# Patient Record
Sex: Male | Born: 1990 | Race: Black or African American | Hispanic: No | Marital: Single | Smoking: Never smoker
Health system: Southern US, Community
[De-identification: ages and names within clinical notes are randomized; demographics above are authoritative.]

---

## 2018-07-11 ENCOUNTER — Emergency Department (HOSPITAL_COMMUNITY)
Admission: EM | Admit: 2018-07-11 | Discharge: 2018-07-11 | Disposition: A | Discharge status: Home / Self Care | Payer: Self-pay | Attending: Emergency Medicine | Admitting: Emergency Medicine

## 2018-07-11 ENCOUNTER — Encounter (HOSPITAL_COMMUNITY): Payer: Self-pay | Admitting: Emergency Medicine

## 2018-07-11 ENCOUNTER — Other Ambulatory Visit: Payer: Self-pay

## 2018-07-11 DIAGNOSIS — T23221A Burn of second degree of single right finger (nail) except thumb, initial encounter: Secondary | ICD-10-CM | POA: Insufficient documentation

## 2018-07-11 DIAGNOSIS — Y929 Unspecified place or not applicable: Secondary | ICD-10-CM | POA: Insufficient documentation

## 2018-07-11 DIAGNOSIS — T23101A Burn of first degree of right hand, unspecified site, initial encounter: Secondary | ICD-10-CM | POA: Insufficient documentation

## 2018-07-11 DIAGNOSIS — X102XXA Contact with fats and cooking oils, initial encounter: Secondary | ICD-10-CM | POA: Insufficient documentation

## 2018-07-11 DIAGNOSIS — Y93G3 Activity, cooking and baking: Secondary | ICD-10-CM | POA: Insufficient documentation

## 2018-07-11 DIAGNOSIS — Z23 Encounter for immunization: Secondary | ICD-10-CM | POA: Insufficient documentation

## 2018-07-11 DIAGNOSIS — Y999 Unspecified external cause status: Secondary | ICD-10-CM | POA: Insufficient documentation

## 2018-07-11 MED ORDER — BACITRACIN ZINC 500 UNIT/GM EX OINT
1.0000 "application " | TOPICAL_OINTMENT | Freq: Two times a day (BID) | CUTANEOUS | Status: DC
  Administered 2018-07-11: 1 via TOPICAL
  Filled 2018-07-11: qty 0.9

## 2018-07-11 MED ORDER — NAPROXEN 500 MG PO TABS: 500.0000 mg | ORAL_TABLET | Freq: Two times a day (BID) | ORAL | 0 refills | Status: AC | PRN

## 2018-07-11 MED ORDER — BACITRACIN ZINC 500 UNIT/GM EX OINT: 1.0000 "application " | TOPICAL_OINTMENT | Freq: Two times a day (BID) | CUTANEOUS | 2 refills | Status: AC

## 2018-07-11 MED ORDER — NAPROXEN 250 MG PO TABS
500.0000 mg | ORAL_TABLET | Freq: Once | ORAL | Status: AC
  Administered 2018-07-11: 500 mg via ORAL
  Filled 2018-07-11: qty 2

## 2018-07-11 MED ORDER — TETANUS-DIPHTH-ACELL PERTUSSIS 5-2.5-18.5 LF-MCG/0.5 IM SUSP
0.5000 mL | Freq: Once | INTRAMUSCULAR | Status: AC
  Administered 2018-07-11: 0.5 mL via INTRAMUSCULAR
  Filled 2018-07-11: qty 0.5

## 2018-07-11 NOTE — Discharge Instructions (Signed)
Apply bacitracin to your burns twice a day as prescribed. Try to keep your blisters intact and your burns wrapped for protection and to prevent infection. Use naproxen for pain as prescribed. You can return to the ED for any new or concerning symptoms.
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
## 2020-03-21 ENCOUNTER — Emergency Department (HOSPITAL_COMMUNITY)
Admission: EM | Admit: 2020-03-21 | Discharge: 2020-03-21 | Disposition: A | Discharge status: Home / Self Care | Payer: Self-pay | Attending: Emergency Medicine | Admitting: Emergency Medicine

## 2020-03-21 ENCOUNTER — Emergency Department (HOSPITAL_COMMUNITY): Payer: Self-pay

## 2020-03-21 DIAGNOSIS — Y93E5 Activity, floor mopping and cleaning: Secondary | ICD-10-CM | POA: Insufficient documentation

## 2020-03-21 DIAGNOSIS — T148XXA Other injury of unspecified body region, initial encounter: Secondary | ICD-10-CM

## 2020-03-21 DIAGNOSIS — Z23 Encounter for immunization: Secondary | ICD-10-CM | POA: Insufficient documentation

## 2020-03-21 DIAGNOSIS — Y990 Civilian activity done for income or pay: Secondary | ICD-10-CM | POA: Insufficient documentation

## 2020-03-21 DIAGNOSIS — W230XXA Caught, crushed, jammed, or pinched between moving objects, initial encounter: Secondary | ICD-10-CM | POA: Insufficient documentation

## 2020-03-21 DIAGNOSIS — S6991XA Unspecified injury of right wrist, hand and finger(s), initial encounter: Secondary | ICD-10-CM

## 2020-03-21 DIAGNOSIS — S60111A Contusion of right thumb with damage to nail, initial encounter: Secondary | ICD-10-CM | POA: Insufficient documentation

## 2020-03-21 DIAGNOSIS — S6010XA Contusion of unspecified finger with damage to nail, initial encounter: Secondary | ICD-10-CM

## 2020-03-21 MED ORDER — IBUPROFEN 400 MG PO TABS
600.0000 mg | ORAL_TABLET | Freq: Once | ORAL | Status: AC
  Administered 2020-03-21: 600 mg via ORAL
  Filled 2020-03-21: qty 1

## 2020-03-21 MED ORDER — TETANUS-DIPHTH-ACELL PERTUSSIS 5-2.5-18.5 LF-MCG/0.5 IM SUSY
0.5000 mL | PREFILLED_SYRINGE | Freq: Once | INTRAMUSCULAR | Status: AC
  Administered 2020-03-21: 0.5 mL via INTRAMUSCULAR
  Filled 2020-03-21: qty 0.5

## 2020-03-21 NOTE — ED Triage Notes (Signed)
Pt here from work got his right thumb stuck in a roller at work able to move it but has an abrasion to that [employer]

## 2020-03-21 NOTE — ED Provider Notes (Signed)
[facility] EMERGENCY DEPARTMENT Provider Note   CSN: [id number] Arrival date & time: 03/21/20  0744     History Chief Complaint  Patient presents with  . Finger Injury    Rt Thumb    Jared Mckenzie is a 30 y.o. male who presents to the ED today with complaint of right thumb injury that occurred around 4 AM this morning at work. Pt reports he was cleaning a rolling machine at work with a cloth when the roller sucked the cloth up and his thumb got caught underneath the roller. Pt reports bleeding to the thumb that continued to bleed at work despite pressure prompting him to come to the ED today. He is unsure regarding tetanus status but believes it has been > 10 years. Pt has not taken anything for pain. He has full ROM of his thumb and denies any pain with this. No other complaints.   The history is provided by the patient and medical records.       No past medical history on file.  There are no problems to display for this patient.   No past surgical history on file.     No family history on file.  Social History   Tobacco Use  . Smoking status: Never Smoker  . Smokeless tobacco: Never Used  Substance Use Topics  . Alcohol use: Yes  . Drug use: Never    Home Medications Prior to Admission medications   Medication Sig Start Date End Date Taking? Authorizing Provider  bacitracin ointment Apply 1 application topically 2 (two) times daily. 07/11/18   Antony Madura, PA-C  naproxen (NAPROSYN) 500 MG tablet Take 1 tablet (500 mg total) by mouth every 12 (twelve) hours as needed for mild pain or moderate pain. 07/11/18   Antony Madura, PA-C    Allergies    Patient has no known allergies.  Review of Systems   Review of Systems  Constitutional: Negative for chills and fever.  Musculoskeletal: Positive for arthralgias.  Skin: Positive for wound.  All other systems reviewed and are negative.   Physical Exam Updated Vital Signs BP 112/80 (BP Location: Left  Arm)   Pulse 78   Temp 98.1 F (36.7 C)   Resp 16   SpO2 100%   Physical Exam Vitals and nursing note reviewed.  Constitutional:      Appearance: He is not ill-appearing.  HENT:     Head: Normocephalic and atraumatic.  Eyes:     Conjunctiva/sclera: Conjunctivae normal.  Cardiovascular:     Rate and Rhythm: Normal rate and regular rhythm.     Pulses: Normal pulses.  Pulmonary:     Effort: Pulmonary effort is normal.     Breath sounds: Normal breath sounds. No wheezing, rhonchi or rales.  Musculoskeletal:     Comments: Right dorsal thumb with skin avulsion to the lateral aspect of the distal phalanx just proximal to the nail. Subungual hematoma noted as well with some bleeding from nailbed with surrounding TTP. ROM intact to MCP and DIP joint of thumb. Cap refill < 2 seconds. 2+ radial pulse.   Skin:    General: Skin is warm and dry.     Coloration: Skin is not jaundiced.  Neurological:     Mental Status: He is alert.     ED Results / Procedures / Treatments   Labs (all labs ordered are listed, but only abnormal results are displayed) Labs Reviewed - No data to display  EKG None  Radiology DG  Hand Complete Right  Result Date: 03/21/2020 CLINICAL DATA:  Right hand pain after injury at work. EXAM: RIGHT HAND - COMPLETE 3+ VIEW COMPARISON:  None. FINDINGS: There is no evidence of acute fracture or dislocation. There is no evidence of arthropathy. Well corticated bone fragment is seen adjacent to the distal portion of the first metacarpal which may represent old injury. Soft tissues are unremarkable. IMPRESSION: No definite acute abnormality seen in the right hand. Well corticated bone fragment seen adjacent to the distal portion of the first metacarpal which may represent old injury. Electronically Signed   By: Lupita Raider M.D.   On: 03/21/2020 08:49    Procedures Procedures   Medications Ordered in ED Medications  Tdap (BOOSTRIX) injection 0.5 mL (0.5 mLs  Intramuscular Given 03/21/20 0935)  ibuprofen (ADVIL) tablet 600 mg (600 mg Oral Given 03/21/20 0935)    ED Course  I have reviewed the triage vital signs and the nursing notes.  Pertinent labs & imaging results that were available during my care of the patient were reviewed by me and considered in my medical decision making (see chart for details).    MDM Rules/Calculators/A&P                          30 year old male presents to the ED for right thumb injury that occurred around 4 AM this morning at work.  Hand got caught in a roller causing a skin avulsion to the dorsal aspect of the right thumb just proximal to the nail as well as subungual hematoma some active bleeding underneath the nail bed.  Tetanus status unknown.  An x-ray was obtained prior to being seen which did not show any acute findings.  Does show a small cortical bone fragment to the first metacarpal from an old injury.  Patient has no tenderness to this area.  His tenderness remains along the nailbed as well as the distal phalanx where his skin avulsion is.  We will plan to soak hand in Betadine and sterile saline.  Patient does have dried paint to his hand as well which will need to be cleaned off.  Will update tetanus.  There is nothing to be sewn/closed at this time.  Will bandage wound afterwards and discharged home.  Wound cleaned with iodine and sterile saline and dressed. Pt instructed to keep would clean and dry. Ibuprofen PRN for pain and to return to the ED for any signs of infection. Pt is in agreement with plan and stable for discharge home.   This note was prepared using Dragon voice recognition software and may include unintentional dictation errors due to the inherent limitations of voice recognition software.   Final Clinical Impression(s) / ED Diagnoses Final diagnoses:  Injury of finger of right hand, initial encounter  Subungual hematoma of digit of hand, initial encounter  Skin avulsion    Rx / DC  Orders ED Discharge Orders    None       Discharge Instructions     Please keep wound clean and dry. You can apply bacitracin (neosporin) to the wound to help with healing. While at work please keep wound covered to prevent contamination.   While at home please take 600 mg Ibuprofen every 6 hours as needed for pain. You can also apply ice to the area to help with inflammation.   Return to the ED IMMEDIATELY for any signs of infection including redness/swelling around the wound, drainage of pus, fevers >  100.4, chills, or any other associated symptoms       Tanda Rockers, PA-C 03/21/20 0959    Gerhard Munch, MD 03/24/20 [phone]

## 2020-03-21 NOTE — Discharge Instructions (Signed)
Please keep wound clean and dry. You can apply bacitracin (neosporin) to the wound to help with healing. While at work please keep wound covered to prevent contamination.   While at home please take 600 mg Ibuprofen every 6 hours as needed for pain. You can also apply ice to the area to help with inflammation.   Return to the ED IMMEDIATELY for any signs of infection including redness/swelling around the wound, drainage of pus, fevers > 100.4, chills, or any other associated symptoms

## 2022-06-03 IMAGING — DX DG HAND COMPLETE 3+V*R*
3 series · 3 of 3 positions shown · non-contrast
Comparison: None.

CLINICAL DATA: Right hand pain after injury at work.

EXAM:
RIGHT HAND - COMPLETE 3+ VIEW

[x hand pa right]
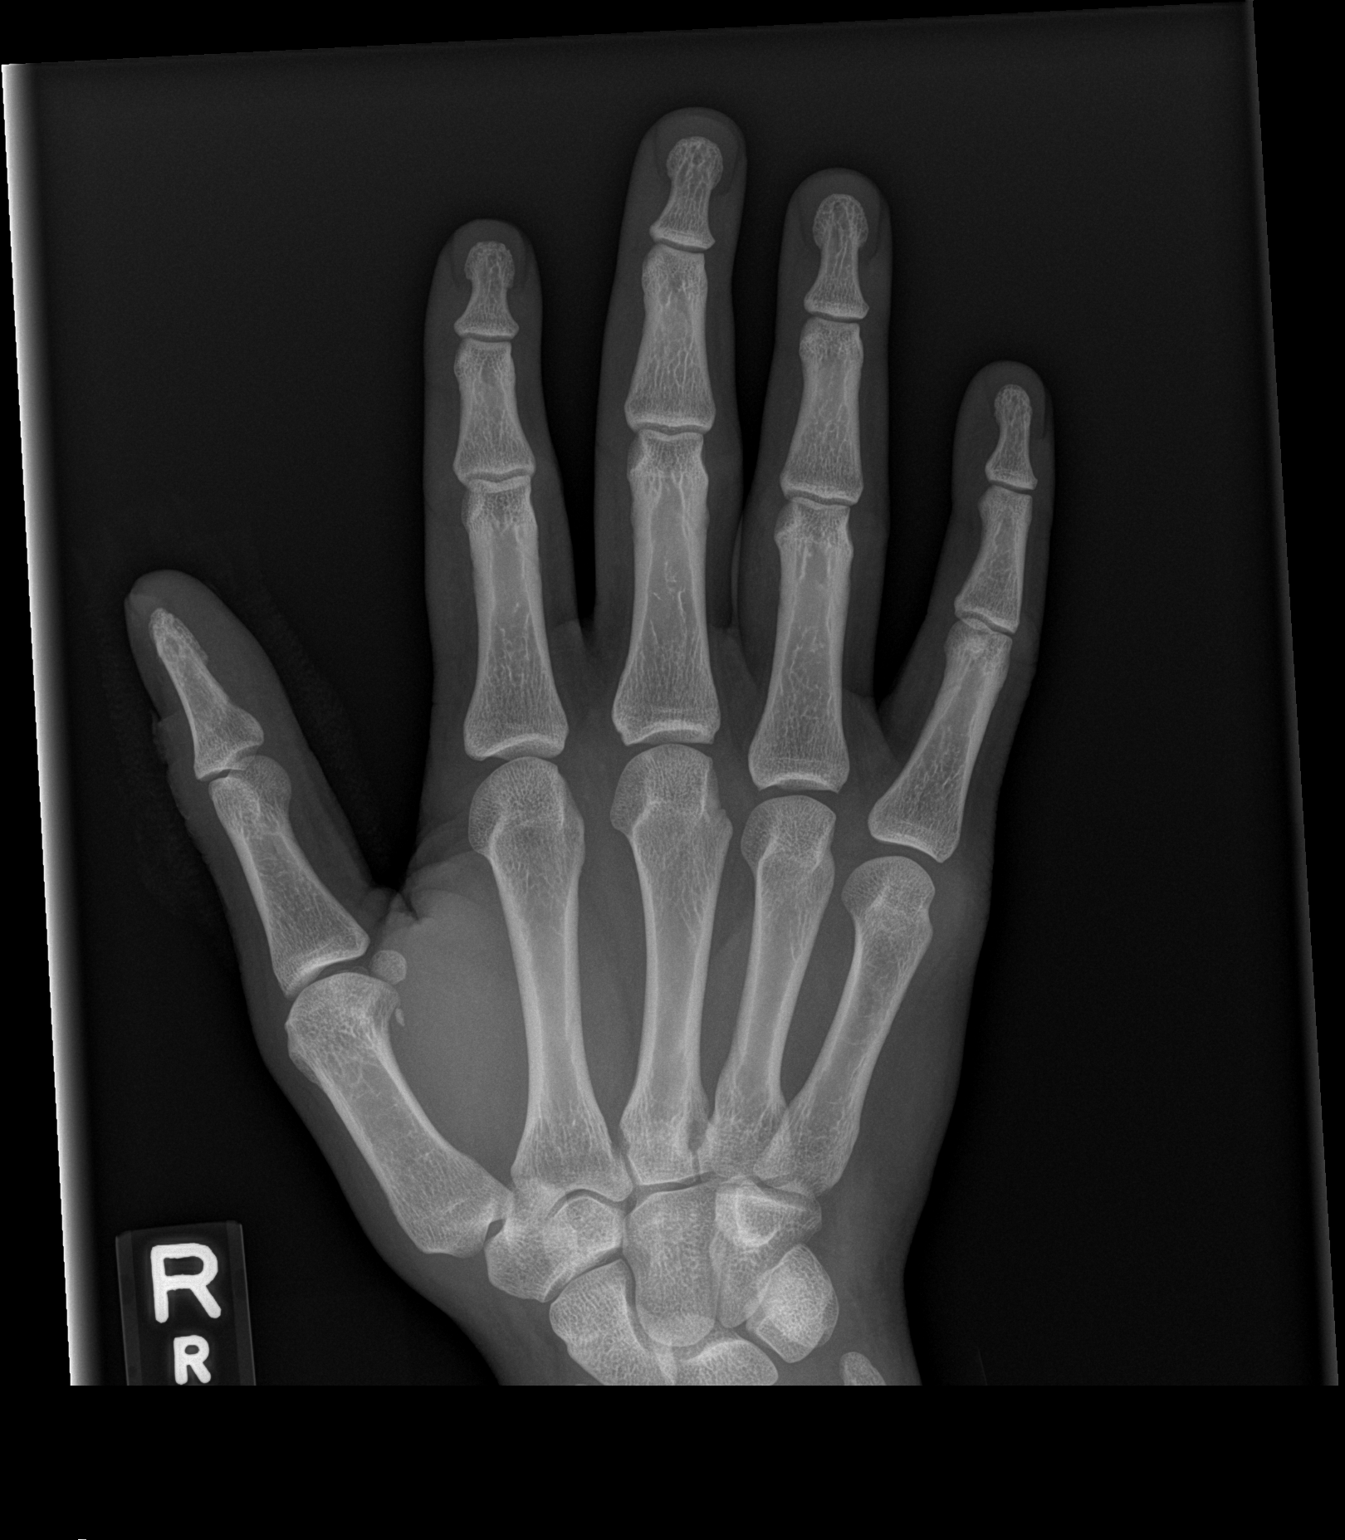

[x hand obl right]
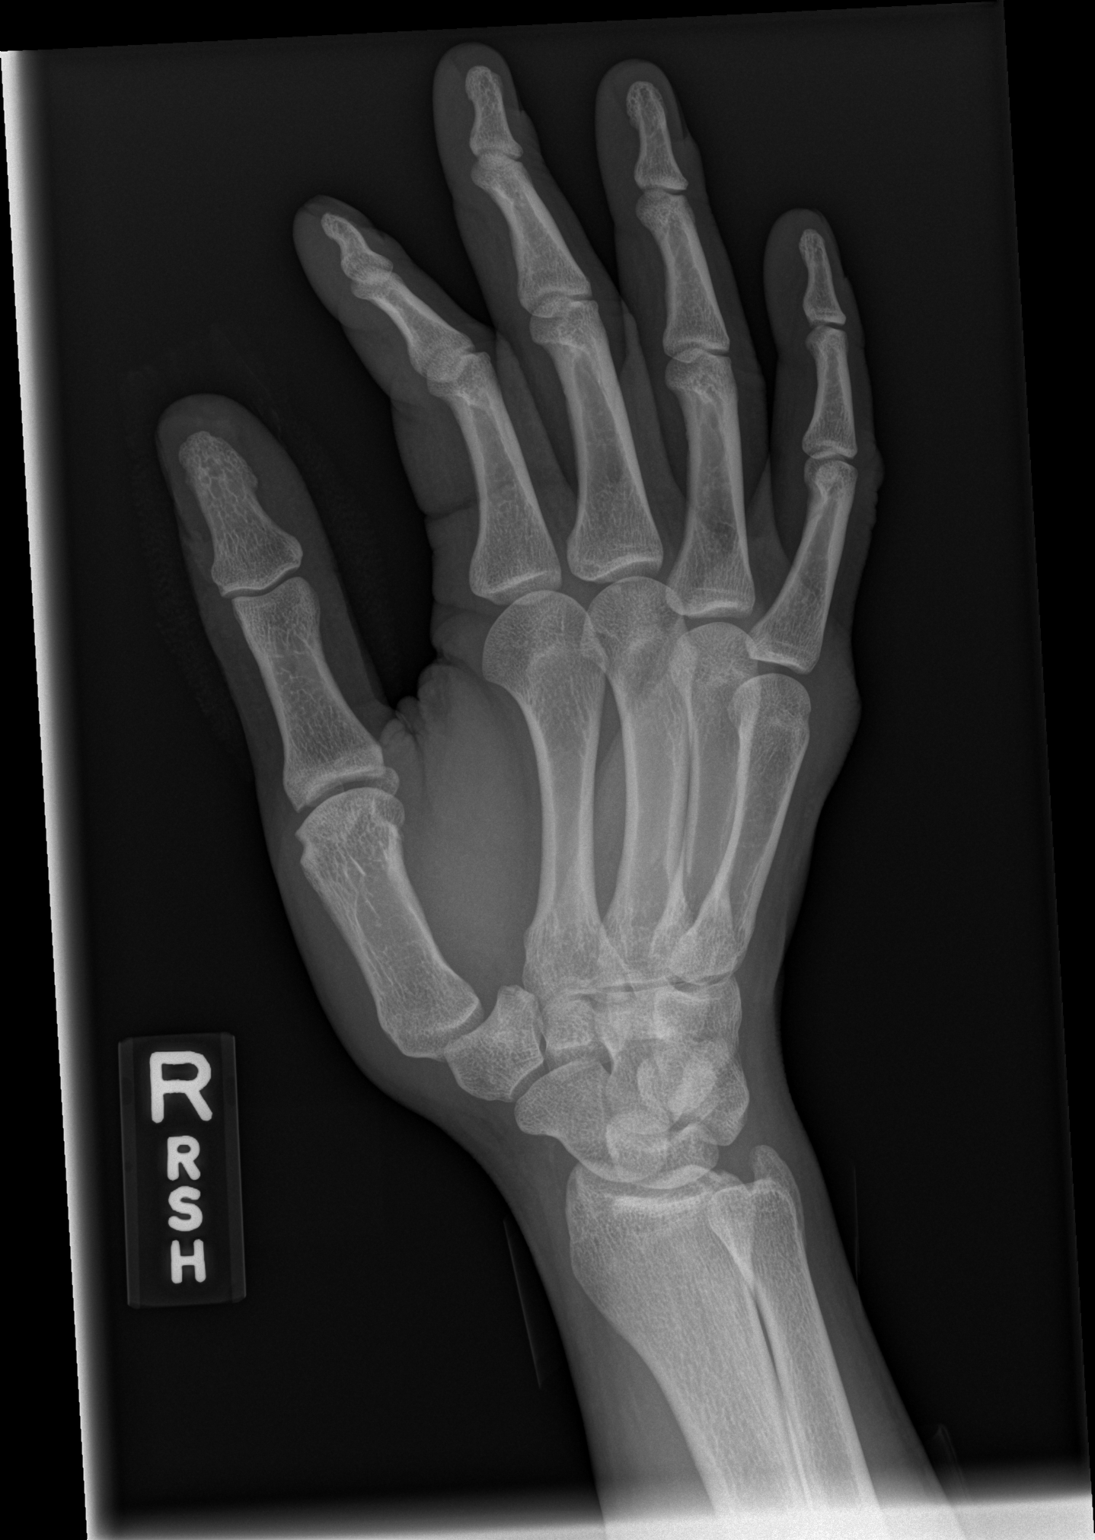

[x hand lat right]
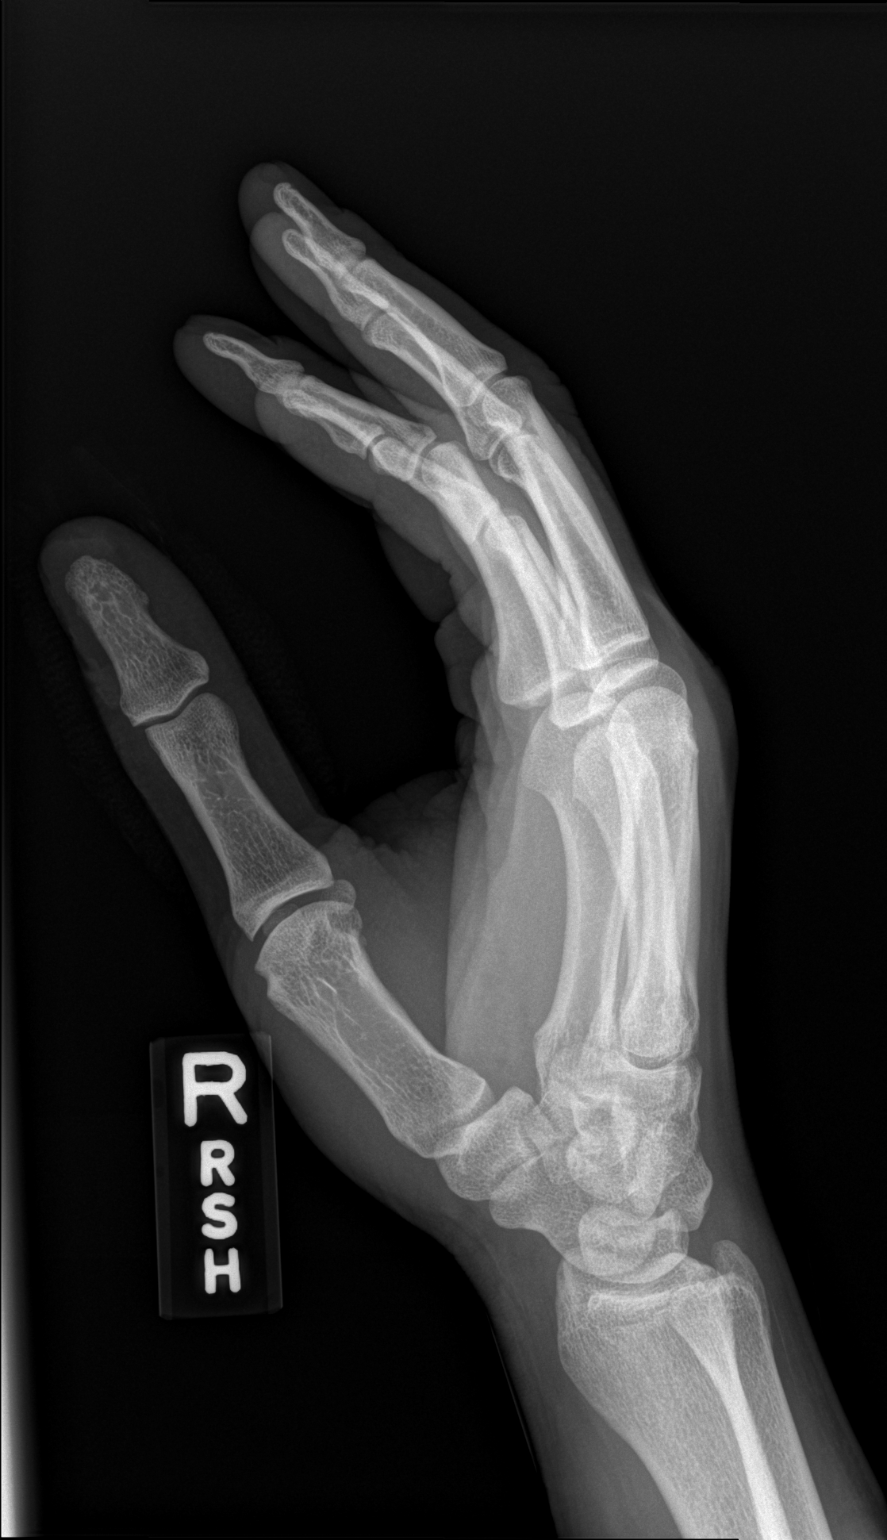

[3 of 3 positions shown; findings below may reference images not displayed]

FINDINGS: There is no evidence of acute fracture or dislocation. There is no
evidence of arthropathy. Well corticated bone fragment is seen
adjacent to the distal portion of the first metacarpal which may
represent old injury. Soft tissues are unremarkable.
IMPRESSION: No definite acute abnormality seen in the right hand. Well
corticated bone fragment seen adjacent to the distal portion of the
first metacarpal which may represent old injury.

## 2023-04-05 DEATH — deceased
